# Patient Record
Sex: Female | Born: 1975 | Race: Black or African American | Hispanic: No | Marital: Married
Health system: Southern US, Community
[De-identification: ages and names within clinical notes are randomized; demographics above are authoritative.]

## PROBLEM LIST (undated history)

## (undated) DIAGNOSIS — F32A Depression, unspecified: Secondary | ICD-10-CM

## (undated) DIAGNOSIS — F329 Major depressive disorder, single episode, unspecified: Secondary | ICD-10-CM

---

## 2017-09-13 ENCOUNTER — Other Ambulatory Visit: Payer: Self-pay

## 2017-09-13 ENCOUNTER — Encounter (HOSPITAL_COMMUNITY): Payer: Self-pay | Admitting: Nurse Practitioner

## 2017-09-13 ENCOUNTER — Emergency Department (HOSPITAL_COMMUNITY)
Admission: EM | Admit: 2017-09-13 | Discharge: 2017-09-13 | Disposition: A | Discharge status: Home / Self Care | Payer: Managed Care, Other (non HMO) | Attending: Emergency Medicine | Admitting: Emergency Medicine

## 2017-09-13 DIAGNOSIS — R4584 Anhedonia: Secondary | ICD-10-CM | POA: Diagnosis present

## 2017-09-13 DIAGNOSIS — R41840 Attention and concentration deficit: Secondary | ICD-10-CM | POA: Insufficient documentation

## 2017-09-13 DIAGNOSIS — F329 Major depressive disorder, single episode, unspecified: Secondary | ICD-10-CM | POA: Insufficient documentation

## 2017-09-13 DIAGNOSIS — R5383 Other fatigue: Secondary | ICD-10-CM | POA: Diagnosis not present

## 2017-09-13 DIAGNOSIS — Z008 Encounter for other general examination: Secondary | ICD-10-CM | POA: Insufficient documentation

## 2017-09-13 DIAGNOSIS — R45851 Suicidal ideations: Secondary | ICD-10-CM | POA: Diagnosis not present

## 2017-09-13 DIAGNOSIS — G479 Sleep disorder, unspecified: Secondary | ICD-10-CM | POA: Diagnosis not present

## 2017-09-13 HISTORY — DX: Depression, unspecified: F32.A

## 2017-09-13 HISTORY — DX: Major depressive disorder, single episode, unspecified: F32.9

## 2017-09-13 LAB — SALICYLATE LEVEL

## 2017-09-13 LAB — CBC WITH DIFFERENTIAL/PLATELET
Basophils Absolute: 0 10*3/uL (ref 0.0–0.1)
Basophils Relative: 1 %
EOS ABS: 0 10*3/uL (ref 0.0–0.7)
Eosinophils Relative: 0 %
HEMATOCRIT: 37.8 % (ref 36.0–46.0)
HEMOGLOBIN: 11.8 g/dL — AB (ref 12.0–15.0)
LYMPHS ABS: 1.5 10*3/uL (ref 0.7–4.0)
LYMPHS PCT: 30 %
MCH: 24.6 pg — AB (ref 26.0–34.0)
MCHC: 31.2 g/dL (ref 30.0–36.0)
MCV: 78.8 fL (ref 78.0–100.0)
MONOS PCT: 6 %
Monocytes Absolute: 0.3 10*3/uL (ref 0.1–1.0)
NEUTROS ABS: 3 10*3/uL (ref 1.7–7.7)
NEUTROS PCT: 63 %
Platelets: 380 10*3/uL (ref 150–400)
RBC: 4.8 MIL/uL (ref 3.87–5.11)
RDW: 15.5 % (ref 11.5–15.5)
WBC: 4.8 10*3/uL (ref 4.0–10.5)

## 2017-09-13 LAB — BASIC METABOLIC PANEL
Anion gap: 5 (ref 5–15)
BUN: 8 mg/dL (ref 6–20)
CHLORIDE: 111 mmol/L (ref 101–111)
CO2: 23 mmol/L (ref 22–32)
CREATININE: 0.58 mg/dL (ref 0.44–1.00)
Calcium: 9 mg/dL (ref 8.9–10.3)
GFR calc Af Amer: >60 mL/min (ref >60)
GFR calc non Af Amer: >60 mL/min (ref >60)
Glucose, Bld: 88 mg/dL (ref 65–99)
POTASSIUM: 3.7 mmol/L (ref 3.5–5.1)
Sodium: 139 mmol/L (ref 135–145)

## 2017-09-13 LAB — I-STAT BETA HCG BLOOD, ED (MC, WL, AP ONLY)

## 2017-09-13 LAB — ACETAMINOPHEN LEVEL: Acetaminophen (Tylenol), Serum: <10 ug/mL — ABNORMAL LOW (ref 10–30)

## 2017-09-13 LAB — ETHANOL

## 2017-09-13 MED ORDER — ACETAMINOPHEN 325 MG PO TABS: 650.0000 mg | ORAL_TABLET | ORAL | Status: DC | PRN

## 2017-09-13 MED ORDER — ONDANSETRON HCL 4 MG PO TABS: 4.0000 mg | ORAL_TABLET | Freq: Three times a day (TID) | ORAL | Status: DC | PRN

## 2017-09-13 NOTE — Progress Notes (Signed)
Patient ID: Stacy Macdonald, female   DOB: 1976/02/06, 42 y.o.   MRN: [id number]  Pt was seen and chart reviewed with treatment team. Pt denies suicidal/homicidal ideation, denies auditory/visual hallucinations and does not appear to be responding to internal stimuli. Pt does endorse a major depressive episode this weekend with crying and thoughts of self harm. Pt denies having a plan. Pt lives with her husband who is supportive and feels safe at home. Pt is interested in intensive outpatient therapy and has an appointment tomorrow at 1330 with [facility] Out[facility] services. Pt is able to contract for safety upon discharge. Pt is psychiatrically clear for discharge.    Ethelene Hal, NP-C 09-13-2017        [phone]

## 2017-09-13 NOTE — ED Provider Notes (Signed)
[facility] DEPT Provider Note   CSN: [id number] Arrival date & time: 09/13/17  1157     History   Chief Complaint Chief Complaint  Patient presents with  . Suicidal    HPI Stacy Macdonald is a 42 y.o. female with a history of depression who presents to the emergency department today for suicidal ideation.  Patient states that since undergoing bariatric surgery in November she feels her Effexor is not working as it should.  She states that she has become more sad with loss of interest, feelings of worthlessness, lack of energy, difficulty concentrating, difficulty sleeping, irritability.  For this reason the patient states she went to her PCP office today.  When asked about suicidal ideation the patient states over the weekend she has been experiencing thoughts of harming herself.  If she was to harm herself she would overdose but does not have access to this means.  She denies prior attempts of self-harm.  She denies alcohol or drug use.  No homicidal ideation.  She denies any physical complaints at this time.  HPI  Past Medical History:  Diagnosis Date  . Depression     There are no active problems to display for this patient.   History reviewed. No pertinent surgical history.  OB History    No data available       Home Medications    Prior to Admission medications   Not on File    Family History History reviewed. No pertinent family history.  Social History Social History   Tobacco Use  . Smoking status: Not on file  Substance Use Topics  . Alcohol use: No    Frequency: Never  . Drug use: No     Allergies   Patient has no allergy information on record.   Review of Systems Review of Systems  All other systems reviewed and are negative.    Physical Exam Updated Vital Signs BP 134/80 (BP Location: Right Arm)   Pulse (!) 54   Temp 98.3 F (36.8 C) (Oral)   Resp 17   Ht 5' 8"  (1.727 m)   Wt 114.8 kg (253 lb)    SpO2 100%   BMI 38.47 kg/m   Physical Exam  Constitutional: She appears well-developed and well-nourished.  HENT:  Head: Normocephalic and atraumatic.  Right Ear: External ear normal.  Left Ear: External ear normal.  Nose: Nose normal.  Mouth/Throat: Uvula is midline, oropharynx is clear and moist and mucous membranes are normal. No tonsillar exudate.  Eyes: Pupils are equal, round, and reactive to light. Right eye exhibits no discharge. Left eye exhibits no discharge. No scleral icterus.  Neck: Trachea normal. Neck supple. No spinous process tenderness present. No neck rigidity. Normal range of motion present.  Cardiovascular: Normal rate, regular rhythm and intact distal pulses.  No murmur heard. Pulses:      Radial pulses are 2+ on the right side, and 2+ on the left side.       Dorsalis pedis pulses are 2+ on the right side, and 2+ on the left side.       Posterior tibial pulses are 2+ on the right side, and 2+ on the left side.  No lower extremity swelling or edema. Calves symmetric in size bilaterally.  Pulmonary/Chest: Effort normal and breath sounds normal. She exhibits no tenderness.  Abdominal: Soft. Bowel sounds are normal. She exhibits no distension. There is no tenderness. There is no rigidity, no rebound and no guarding.  Musculoskeletal: She  exhibits no edema.  Lymphadenopathy:    She has no cervical adenopathy.  Neurological: She is alert.  Skin: Skin is warm and dry. No rash noted. She is not diaphoretic.  Psychiatric: She has a normal mood and affect.  Nursing note and vitals reviewed.    ED Treatments / Results  Labs (all labs ordered are listed, but only abnormal results are displayed) Labs Reviewed  CBC WITH DIFFERENTIAL/PLATELET - Abnormal; Notable for the following components:      Result Value   Hemoglobin 11.8 (*)    MCH 24.6 (*)    All other components within normal limits  ACETAMINOPHEN LEVEL - Abnormal; Notable for the following components:    Acetaminophen (Tylenol), Serum <10 (*)    All other components within normal limits  BASIC METABOLIC PANEL  SALICYLATE LEVEL  ETHANOL  RAPID URINE DRUG SCREEN, HOSP PERFORMED  I-STAT BETA HCG BLOOD, ED ([facility], [facility], [facility] ONLY)    EKG  EKG Interpretation None       Radiology No results found.  Procedures Procedures (including critical care time)  Medications Ordered in ED Medications - No data to display   Initial Impression / Assessment and Plan / ED Course  I have reviewed the triage vital signs and the nursing notes.  Pertinent labs & imaging results that were available during my care of the patient were reviewed by me and considered in my medical decision making (see chart for details).     Pt presents to the ED for medical clearance.  Pt is not currently having SI with a plan to OD. She denies any HI ideations. The patients demeanor is dysphoric. Admits difficulty sleeping, loss of interests, feelings of worthlessness, lack of energy, difficulty concentrating, loss of appetite, feelings of anxiety. The patient currently does not have any acute physical complaints and is in no acute distress. TTS consulted. Pt was moved to Psych ED for further evaluation. Patient is medically cleared.   TTS recommended discharge and patient will do follow up as outpatient tomorrow for further care. She is in agreement with plan. Appears safe for discharge.   Final Clinical Impressions(s) / ED Diagnoses   Final diagnoses:  Encounter for medical clearance for patient hold    ED Discharge Orders    None       Lorelle Gibbs 09/13/17 1535    Virgel Manifold, MD 09/13/17 1630

## 2017-09-13 NOTE — BH Assessment (Addendum)
Tele Assessment Note   Patient Name: Stacy Macdonald MRN: [id number] Referring Physician: Alferd Apa Location of Patient: [facility] Location of Provider: [place] is an 42 y.o. female who presents voluntarily accompanied by Mobile Crisis reporting symptoms of depression and suicidal ideation this past weekend with no intent. She states that she was worried because she has never had thoughts like this before. Pt has a history of depression and has been taking Effexor for 1 year, but feels that it has not worked after her recent bariatric surgery in November.  Pt reports medication compliance. Pt denies past attempts. 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 PT denies homicidal ideation/ history of violence. Pt denies auditory or visual hallucinations or other psychotic symptoms. Pt states current stressors include work and the loss of her brother 3 weeks ago.   Pt lives with her husband and 65 yo son, who she says are supportive. Pt denies history of abuse and trauma. Pt reports there is a family history of her father being Bipolar. Pt has good insight and judgment. Pt's memory is typical. Pt denies legal history. ? Pt's OP history includes medication mgt from her PCP, but not previous OP or Ip treatment. Pt denies alcohol/ substance abuse. ? MSE: Pt is casually dressed, alert, oriented x4 with normal speech and normal motor behavior. Eye contact is good. Pt's mood is depressed and affect is depressed and anxious. Affect is congruent with mood. Thought process is coherent and relevant. There is no indication pt is currently responding to internal stimuli or experiencing delusional thought content. Pt was cooperative throughout assessment. Pt is currently able to contract for safety outside the hospital and wants IOP psychiatric treatment.  Jinny Blossom, NP recommends IOP  treatment.  Writer spoke with Ava at [facility] OP and made appt for 1:30 tomorrow for partial hospitalization intake. Notified pt and EDP.     Diagnosis: MDD, single episode  Past Medical History:  Past Medical History:  Diagnosis Date  . Depression     History reviewed. No pertinent surgical history.  Family History: History reviewed. No pertinent family history.  Social History:  reports that she does not drink alcohol or use drugs. Her tobacco history is not on file.  Additional Social History:  Alcohol / Drug Use Pain Medications: denies Prescriptions: denies Over the Counter: denies History of alcohol / drug use?: No history of alcohol / drug abuse Longest period of sobriety (when/how long): denies Negative Consequences of Use: (denies) Withdrawal Symptoms: (denies)  CIWA: CIWA-Ar BP: 134/80 Pulse Rate: (!) 54 COWS:    PATIENT STRENGTHS: (choose at least two) Ability for insight Average or above average intelligence Occupational psychologist fund of knowledge Motivation for treatment/growth Physical Health Supportive family/friends Work skills  Allergies: No Known Allergies  Home Medications:  (Not in a hospital admission)  OB/GYN Status:  No LMP recorded.  General Assessment Data Location of Assessment: [facility] ED TTS Assessment: In system Is this a Tele or Face-to-Face Assessment?: Tele Assessment Is this an Initial Assessment or a Re-assessment for this encounter?: Initial Assessment Marital status: Married Living Arrangements: Spouse/significant other, Children Can pt return to current living arrangement?: Yes Admission Status: Voluntary Is patient capable of signing voluntary admission?: Yes Referral Source: Technical sales engineer Crisis) Insurance type: Garment/textile technologist     Crisis Care Plan Living Arrangements: Spouse/significant other, Children Legal Guardian: (self) Name of Psychiatrist: none Name of Therapist: none  Education Status Is  patient currently in school?: No  Risk to self with the past 6 months Suicidal Ideation: No-Not Currently/Within Last 6 Months Has patient been a risk to self within the past 6 months prior to admission? : No Suicidal Intent: No Has patient had any suicidal intent within the past 6 months prior to admission? : No Is patient at risk for suicide?: Yes Suicidal Plan?: No-Not Currently/Within Last 6 Months Has patient had any suicidal plan within the past 6 months prior to admission? : Yes Access to Means: No What has been your use of drugs/alcohol within the last 12 months?: denies Previous Attempts/Gestures: No Triggers for Past Attempts: None known Intentional Self Injurious Behavior: None Family Suicide History: No Recent stressful life event(s): Loss (Comment)(bariatric surgery,stres at work) Persecutory voices/beliefs?: No Depression: Yes Depression Symptoms: Insomnia, Isolating, Loss of interest in usual pleasures, Feeling worthless/self pity, Fatigue Substance abuse history and/or treatment for substance abuse?: No Suicide prevention information given to non-admitted patients: Not applicable  Risk to Others within the past 6 months Homicidal Ideation: No Does patient have any lifetime risk of violence toward others beyond the six months prior to admission? : No Thoughts of Harm to Others: No Current Homicidal Intent: No Current Homicidal Plan: No Access to Homicidal Means: No History of harm to others?: No Assessment of Violence: None Noted Does patient have access to weapons?: No Criminal Charges Pending?: No Does patient have a court date: No Is patient on probation?: No  Psychosis Hallucinations: None noted Delusions: None noted  Mental Status Report Appearance/Hygiene: Unremarkable Eye Contact: Good Motor Activity: Unremarkable Speech: Logical/coherent Level of Consciousness: Alert Mood: Depressed, Anxious Affect: Depressed, Anxious Anxiety Level:  Moderate Thought Processes: Coherent, Relevant Judgement: Unimpaired Orientation: Person, Place, Time, Situation, Appropriate for developmental age Obsessive Compulsive Thoughts/Behaviors: None  Cognitive Functioning Concentration: Decreased Memory: Recent Intact, Remote Intact IQ: Above Average Insight: Good Impulse Control: Good Appetite: Fair Weight Loss: 40(due to bariatric surgery) Weight Gain: 0 Sleep: Decreased Total Hours of Sleep: (3-4 hrs) Vegetative Symptoms: None  ADLScreening [facility] Assessment Services) Patient's cognitive ability adequate to safely complete daily activities?: Yes Patient able to express need for assistance with ADLs?: Yes Independently performs ADLs?: Yes (appropriate for developmental age)  Prior Inpatient Therapy Prior Inpatient Therapy: No  Prior Outpatient Therapy Prior Outpatient Therapy: No Does patient have an ACCT team?: No Does patient have Intensive In-House Services?  : No Does patient have [facility] services? : No Does patient have P4CC services?: No  ADL Screening (condition at time of admission) Patient's cognitive ability adequate to safely complete daily activities?: Yes Is the patient deaf or have difficulty hearing?: No Does the patient have difficulty seeing, even when wearing glasses/contacts?: No Does the patient have difficulty concentrating, remembering, or making decisions?: No Patient able to express need for assistance with ADLs?: Yes Does the patient have difficulty dressing or bathing?: No Independently performs ADLs?: Yes (appropriate for developmental age) Does the patient have difficulty walking or climbing stairs?: No Weakness of Legs: None Weakness of Arms/Hands: None  Home Assistive Devices/Equipment Home Assistive Devices/Equipment: None  Therapy Consults (therapy consults require a physician order) PT Evaluation Needed: No OT Evalulation Needed: No SLP Evaluation Needed: No Abuse/Neglect Assessment  (Assessment to be complete while patient is alone) Abuse/Neglect Assessment Can Be Completed: Yes Physical Abuse: Denies Verbal Abuse: Denies Sexual Abuse: Denies Exploitation of patient/patient's resources: Denies Self-Neglect: Denies Values / Beliefs Cultural Requests During Hospitalization: None Spiritual Requests During Hospitalization: None Consults Spiritual  Care Consult Needed: No Social Work Consult Needed: No Regulatory affairs officer (For Healthcare) Does Patient Have a Medical Advance Directive?: No Would patient like information on creating a medical advance directive?: No - Patient declined    Additional Information 1:1 In Past 12 Months?: No CIRT Risk: No Elopement Risk: No Does patient have medical clearance?: Yes     Disposition:  Disposition Initial Assessment Completed for this Encounter: Yes Disposition of Patient: Outpatient treatment(Partial Hospitalization) Type of outpatient treatment: Psych Intensive Outpatient  This service was provided via telemedicine using a 2-way, interactive audio and video technology.  Names of all persons participating in this telemedicine service and their role in this encounter. Name: Ellouise Newer, MS, [facility] Role: Triage Specialist             [facility] 09/13/2017 3:36 PM

## 2017-09-13 NOTE — Discharge Instructions (Signed)
Please follow handouts and instructions given by behavorial health. If you develop worsening or new concerning symptoms you can return to the emergency department for re-evaluation.

## 2017-09-14 ENCOUNTER — Ambulatory Visit (HOSPITAL_COMMUNITY): Payer: Managed Care, Other (non HMO) | Admitting: Licensed Clinical Social Worker

## 2017-09-15 ENCOUNTER — Telehealth (HOSPITAL_COMMUNITY): Payer: Self-pay | Admitting: Professional

## 2020-05-26 ENCOUNTER — Emergency Department: Admit: 2020-05-26 | Discharge status: Against Medical Advice | Payer: Self-pay

## 2021-05-17 ENCOUNTER — Emergency Department (HOSPITAL_COMMUNITY): Payer: Managed Care, Other (non HMO)

## 2021-05-17 ENCOUNTER — Emergency Department (HOSPITAL_COMMUNITY)
Admission: EM | Admit: 2021-05-17 | Discharge: 2021-05-17 | Disposition: A | Discharge status: Home / Self Care | Payer: Managed Care, Other (non HMO) | Attending: Student | Admitting: Student

## 2021-05-17 ENCOUNTER — Encounter (HOSPITAL_COMMUNITY): Payer: Self-pay

## 2021-05-17 ENCOUNTER — Other Ambulatory Visit: Payer: Self-pay

## 2021-05-17 DIAGNOSIS — M25512 Pain in left shoulder: Secondary | ICD-10-CM | POA: Insufficient documentation

## 2021-05-17 DIAGNOSIS — R55 Syncope and collapse: Secondary | ICD-10-CM | POA: Insufficient documentation

## 2021-05-17 DIAGNOSIS — R4020 Unspecified coma: Secondary | ICD-10-CM

## 2021-05-17 LAB — URINALYSIS, ROUTINE W REFLEX MICROSCOPIC
Bilirubin Urine: NEGATIVE
Glucose, UA: NEGATIVE mg/dL
Hgb urine dipstick: NEGATIVE
Ketones, ur: 5 mg/dL — AB
Leukocytes,Ua: NEGATIVE
Nitrite: NEGATIVE
Protein, ur: 30 mg/dL — AB
Specific Gravity, Urine: 1.014 (ref 1.005–1.030)
pH: 6 (ref 5.0–8.0)

## 2021-05-17 LAB — CBC WITH DIFFERENTIAL/PLATELET
Abs Immature Granulocytes: 0.06 10*3/uL (ref 0.00–0.07)
Basophils Absolute: 0 10*3/uL (ref 0.0–0.1)
Basophils Relative: 0 %
Eosinophils Absolute: 0 10*3/uL (ref 0.0–0.5)
Eosinophils Relative: 0 %
HCT: 37.1 % (ref 36.0–46.0)
Hemoglobin: 10.9 g/dL — ABNORMAL LOW (ref 12.0–15.0)
Immature Granulocytes: 1 %
Lymphocytes Relative: 9 %
Lymphs Abs: 1 10*3/uL (ref 0.7–4.0)
MCH: 22.4 pg — ABNORMAL LOW (ref 26.0–34.0)
MCHC: 29.4 g/dL — ABNORMAL LOW (ref 30.0–36.0)
MCV: 76.3 fL — ABNORMAL LOW (ref 80.0–100.0)
Monocytes Absolute: 0.6 10*3/uL (ref 0.1–1.0)
Monocytes Relative: 6 %
Neutro Abs: 9 10*3/uL — ABNORMAL HIGH (ref 1.7–7.7)
Neutrophils Relative %: 84 %
Platelets: 422 10*3/uL — ABNORMAL HIGH (ref 150–400)
RBC: 4.86 MIL/uL (ref 3.87–5.11)
RDW: 19.2 % — ABNORMAL HIGH (ref 11.5–15.5)
WBC: 10.7 10*3/uL — ABNORMAL HIGH (ref 4.0–10.5)
nRBC: 0 % (ref 0.0–0.2)

## 2021-05-17 LAB — PREGNANCY, URINE: Preg Test, Ur: NEGATIVE

## 2021-05-17 LAB — COMPREHENSIVE METABOLIC PANEL
ALT: 35 U/L (ref 0–44)
AST: 42 U/L — ABNORMAL HIGH (ref 15–41)
Albumin: 3.8 g/dL (ref 3.5–5.0)
Alkaline Phosphatase: 91 U/L (ref 38–126)
Anion gap: 7 (ref 5–15)
BUN: 7 mg/dL (ref 6–20)
CO2: 23 mmol/L (ref 22–32)
Calcium: 7.9 mg/dL — ABNORMAL LOW (ref 8.9–10.3)
Chloride: 105 mmol/L (ref 98–111)
Creatinine, Ser: 0.71 mg/dL (ref 0.44–1.00)
GFR, Estimated: >60 mL/min (ref >60)
Glucose, Bld: 75 mg/dL (ref 70–99)
Potassium: 3.1 mmol/L — ABNORMAL LOW (ref 3.5–5.1)
Sodium: 135 mmol/L (ref 135–145)
Total Bilirubin: 0.6 mg/dL (ref 0.3–1.2)
Total Protein: 7.3 g/dL (ref 6.5–8.1)

## 2021-05-17 LAB — CBG MONITORING, ED: Glucose-Capillary: 70 mg/dL (ref 70–99)

## 2021-05-17 MED ORDER — ONDANSETRON HCL 4 MG/2ML IJ SOLN
4.0000 mg | Freq: Once | INTRAMUSCULAR | Status: AC
  Administered 2021-05-17: 4 mg via INTRAVENOUS
  Filled 2021-05-17: qty 2

## 2021-05-17 MED ORDER — MORPHINE SULFATE (PF) 4 MG/ML IV SOLN
4.0000 mg | Freq: Once | INTRAVENOUS | Status: AC
  Administered 2021-05-17: 4 mg via INTRAVENOUS
  Filled 2021-05-17: qty 1

## 2021-05-17 MED ORDER — ACETAMINOPHEN 325 MG PO TABS
650.0000 mg | ORAL_TABLET | Freq: Once | ORAL | Status: AC
  Administered 2021-05-17: 650 mg via ORAL
  Filled 2021-05-17: qty 2

## 2021-05-17 NOTE — ED Provider Notes (Signed)
[facility] EMERGENCY DEPARTMENT Provider Note   CSN: [id number] Arrival date & time: 05/17/21  1810     History Chief Complaint  Patient presents with   Loss of Consciousness    Stacy Macdonald is a 45 y.o. female.  HPI  Patient with no significant medical history presents to the emergency department with chief complaint of a syncopal episode.  Patient states today she was at the store and started to see spots in her eyes and her vision started to close and then she passed out.  She states prior to passing out she did not have headache, lightheadedness, dizziness, chest pain, shortness of breath, becoming diaphoretic nausea or vomiting.  She denies become incontinent, shaking, biting her tongue, states she is never had this in the past, she has no significant cardiac history, no she of PEs or DVTs.  she states that her only complaint is that she is having some left shoulder pain,  she denies neck pain, back pain, chest pain, abdominal pain, pain in her lower extremities.   She is never had this in the past, denies relieving or aggravating factors.  Past Medical History:  Diagnosis Date   Depression     There are no problems to display for this patient.   History reviewed. No pertinent surgical history.   OB History   No obstetric history on file.     History reviewed. No pertinent family history.  Social History   Substance Use Topics   Alcohol use: No   Drug use: No    Home Medications Prior to Admission medications   Medication Sig Start Date End Date Taking? Authorizing Provider  acetaminophen (TYLENOL) 500 MG tablet Take 1,000 mg by mouth every 6 (six) hours as needed for mild pain or headache.    [provider]  calcium citrate-vitamin D (CELEBRATE CALCIUM CITRATE) 500-500 MG-UNIT chewable tablet Chew 2 tablets by mouth daily.    [provider]  Multiple Vitamin (MULTIVITAMIN WITH MINERALS) TABS tablet Take 1 tablet by mouth daily.     [provider]  tolterodine (DETROL) 1 MG tablet Take 1 mg by mouth 2 (two) times daily.    [provider]  venlafaxine XR (EFFEXOR-XR) 37.5 MG 24 hr capsule Take 37.5 mg by mouth daily with breakfast.    [provider]    Allergies    Patient has no known allergies.  Review of Systems   Review of Systems  Constitutional:  Negative for chills and fever.  HENT:  Negative for congestion.   Respiratory:  Negative for shortness of breath.   Cardiovascular:  Negative for chest pain.  Gastrointestinal:  Negative for abdominal pain.  Genitourinary:  Negative for enuresis.  Musculoskeletal:  Negative for back pain.       Right  shoulder pain.  Skin:  Negative for rash.  Neurological:  Negative for dizziness and headaches.  Hematological:  Does not bruise/bleed easily.   Physical Exam Updated Vital Signs BP (!) 143/86   Pulse 66   Temp 97.8 F (36.6 C) (Oral)   Resp (!) 26   Ht 5' 7"  (1.702 m)   Wt 98.4 kg   LMP 05/04/2021 (Approximate)   SpO2 100%   BMI 33.99 kg/m   Physical Exam Vitals and nursing note reviewed.  Constitutional:      General: She is not in acute distress.    Appearance: She is not ill-appearing.  HENT:     Head: Normocephalic and atraumatic.  Comments: No deformities of the head present, no raccoon eyes or battle sign present.    Nose: No congestion.  Eyes:     Extraocular Movements: Extraocular movements intact.     Conjunctiva/sclera: Conjunctivae normal.     Pupils: Pupils are equal, round, and reactive to light.  Cardiovascular:     Rate and Rhythm: Normal rate and regular rhythm.     Pulses: Normal pulses.     Heart sounds: No murmur heard.   No friction rub. No gallop.  Pulmonary:     Effort: No respiratory distress.     Breath sounds: No wheezing, rhonchi or rales.  Chest:     Chest wall: No tenderness.  Abdominal:     Palpations: Abdomen is soft.     Tenderness: There is no abdominal tenderness. There is  no right CVA tenderness or left CVA tenderness.  Musculoskeletal:     Comments: Patient has full range of motion, 5 of 5 strength neurovascular tact in the upper lower extremities.  She does have slight tenderness to palpation along her left shoulder no crepitus or deformities present.   Skin:    General: Skin is warm and dry.  Neurological:     Mental Status: She is alert.     GCS: GCS eye subscore is 4. GCS verbal subscore is 5. GCS motor subscore is 6.     Cranial Nerves: No cranial nerve deficit.     Sensory: Sensation is intact.     Motor: No weakness.     Coordination: Romberg sign negative. Finger-Nose-Finger Test normal.     Comments: Cranial nerves II through XII grossly intact, patient has no difficulty word finding, no slurring of her words, able to follow two-step commands, no unilateral weakness present.  Psychiatric:        Mood and Affect: Mood normal.    ED Results / Procedures / Treatments   Labs (all labs ordered are listed, but only abnormal results are displayed) Labs Reviewed  COMPREHENSIVE METABOLIC PANEL - Abnormal; Notable for the following components:      Result Value   Potassium 3.1 (*)    Calcium 7.9 (*)    AST 42 (*)    All other components within normal limits  CBC WITH DIFFERENTIAL/PLATELET - Abnormal; Notable for the following components:   WBC 10.7 (*)    Hemoglobin 10.9 (*)    MCV 76.3 (*)    MCH 22.4 (*)    MCHC 29.4 (*)    RDW 19.2 (*)    Platelets 422 (*)    Neutro Abs 9.0 (*)    All other components within normal limits  URINALYSIS, ROUTINE W REFLEX MICROSCOPIC - Abnormal; Notable for the following components:   Ketones, ur 5 (*)    Protein, ur 30 (*)    Bacteria, UA RARE (*)    All other components within normal limits  PREGNANCY, URINE  CBG MONITORING, ED    EKG EKG Interpretation  Date/Time:  Saturday May 17 2021 20:01:09 EDT Ventricular Rate:  87 PR Interval:  164 QRS Duration: 91 QT Interval:  393 QTC  Calculation: 473 R Axis:   40 Text Interpretation: Sinus rhythm Borderline T abnormalities, anterior leads No significant change since last tracing Confirmed by Thayer Jew [phone]) on 05/17/2021 11:18:32 PM  Radiology CT Head Wo Contrast  Result Date: 05/17/2021 CLINICAL DATA:  Facial trauma EXAM: CT HEAD WITHOUT CONTRAST TECHNIQUE: Contiguous axial images were obtained from the base of the skull through the vertex  without intravenous contrast. COMPARISON:  None. FINDINGS: Brain: No evidence of acute infarction, hemorrhage, cerebral edema, mass, mass effect, or midline shift. Ventricles and sulci are normal for age. No extra-axial fluid collection. Vascular: No hyperdense vessel or unexpected calcification. Skull: Normal. Negative for fracture or focal lesion. Sinuses/Orbits: No acute finding. Other: The mastoid air cells are well aerated. No significant hematoma or laceration. IMPRESSION: No acute intracranial process. Electronically Signed   By: Merilyn Baba M.D.   On: 05/17/2021 21:07   DG Chest Port 1 View  Result Date: 05/17/2021 CLINICAL DATA:  Syncopal episode EXAM: PORTABLE CHEST 1 VIEW COMPARISON:  None. FINDINGS: The heart size and mediastinal contours are within normal limits. Both lungs are clear. The visualized skeletal structures are unremarkable. IMPRESSION: No active disease. Electronically Signed   By: Merilyn Baba M.D.   On: 05/17/2021 20:37   DG Shoulder Left Portable  Result Date: 05/17/2021 CLINICAL DATA:  Right shoulder and proximal humeral pain. EXAM: LEFT SHOULDER COMPARISON:  None. FINDINGS: Single view radiograph of the left shoulder as wrong side was ordered. Unremarkable. IMPRESSION: Unremarkable single view radiograph of the left shoulder as wrong side was ordered. Electronically Signed   By: Iven Finn M.D.   On: 05/17/2021 23:00   DG Shoulder Right Port  Result Date: 05/17/2021 CLINICAL DATA:  Syncopal episode today having right shoulder pain. EXAM:  PORTABLE RIGHT SHOULDER COMPARISON:  None. FINDINGS: There is no evidence of fracture or dislocation. There is no evidence of arthropathy or other focal bone abnormality. Soft tissues are unremarkable. IMPRESSION: Negative two-view radiograph of the right shoulder. Electronically Signed   By: Iven Finn M.D.   On: 05/17/2021 23:01   CT Maxillofacial WO CM  Result Date: 05/17/2021 CLINICAL DATA:  Facial trauma EXAM: CT MAXILLOFACIAL WITHOUT CONTRAST TECHNIQUE: Multidetector CT imaging of the maxillofacial structures was performed. Multiplanar CT image reconstructions were also generated. COMPARISON:  None. FINDINGS: Osseous: No fracture or mandibular dislocation. No destructive process. Orbits: Negative. No traumatic or inflammatory finding. Sinuses: Clear. Soft tissues: No soft tissue defect or hematoma. Limited intracranial: No significant or unexpected finding. IMPRESSION: No acute facial bone fracture. Electronically Signed   By: Merilyn Baba M.D.   On: 05/17/2021 20:35    Procedures Procedures   Medications Ordered in ED Medications  acetaminophen (TYLENOL) tablet 650 mg (650 mg Oral Given 05/17/21 2029)  morphine 4 MG/ML injection 4 mg (4 mg Intravenous Given 05/17/21 2231)  ondansetron (ZOFRAN) injection 4 mg (4 mg Intravenous Given 05/17/21 2231)    ED Course  I have reviewed the triage vital signs and the nursing notes.  Pertinent labs & imaging results that were available during my care of the patient were reviewed by me and considered in my medical decision making (see chart for details).    MDM Rules/Calculators/A&P                          Initial impression-patient presents after syncopal episode.  She is alert, does not appear in acute stress, vital signs reassuring.  Will obtain basic lab work-up CT head face shoulder and reassess.  Work-up-CBC shows slight leukocytosis of 10.7, normocytic anemia hemoglobin 10.9 appears to be her baseline.  CMP shows slight hypokalemia  3.1, calcium 7.9, AST 42 UA negative for nitrates, leukocytes, hematuria, urine pregnancy negative, DG of right shoulder negative for acute findings, CT head maxillofacial negative for acute findings, chest x-ray negative.  EKG sinus without signs of ischemia.  Reassessment-patient is reassessed states she has some pain at this time, will provide her with morphine and reassess.  Rule out- low suspicion for CVA or intracranial head bleed as patient denies change in vision, paresthesias or weakness to upper lower extremities, no neuro deficits noted on exam, CT head did not reveal any acute findings.  Low suspicion for seizure as presentation is atypical of etiology, no biting of her tongue, no urine incontinency, no postictal state.  Low suspicion for ACS or arrhythmias as patient denies chest pain, shortness of breath, no hypoperfusion or fluid overload on exam, EKG sinus without signs of ischemia.  Low suspicion for systemic infection as patient is nontoxic-appearing, vital signs reassuring, no obvious source infection noted on exam.  Low suspicion for orthopedic injury as imaging all negative for acute findings.   Plan-  Syncopal episode-possible this is vasovagal, will have her follow-up with PCP for further evaluation.    Vital signs have remained stable, no indication for hospital admission.  Patient discussed with attending and they agreed with assessment and plan.  Patient given at home care as well strict return precautions.  Patient verbalized that they understood agreed to said plan.  Final Clinical Impression(s) / ED Diagnoses Final diagnoses:  Syncope and collapse    Rx / DC Orders ED Discharge Orders     None        Marcello Fennel, PA-C 05/17/21 2323    Teressa Lower, MD 05/18/21 [phone]

## 2021-05-17 NOTE — ED Triage Notes (Signed)
Pt reports being at the grocery store, seeing "flashing lights", and waking up sitting on the floor. VSS and BS 77 with EMS.

## 2021-05-17 NOTE — Discharge Instructions (Addendum)
Lab work and imaging are reassuring.  Suspect pain in your right shoulder is muscular strain recommend over-the-counter pain medications.  Please follow your PCP for further evaluation.  Come back to the emergency department if you develop chest pain, shortness of breath, severe abdominal pain, uncontrolled nausea, vomiting, diarrhea.

## 2021-09-25 IMAGING — CT CT MAXILLOFACIAL W/O CM
3 of 4 series · 16 of 47 positions shown, 19 images · non-contrast
Comparison: None.

CLINICAL DATA: Facial trauma

EXAM:
CT MAXILLOFACIAL WITHOUT CONTRAST
TECHNIQUE: Multidetector CT imaging of the maxillofacial structures was
performed. Multiplanar CT image reconstructions were also generated.

[Series 9: max soft · axial · 0.38mm/px · z∈[+250,+412]mm · 10 of 95 slices shown, 13 images]
[im 7/95  brain]
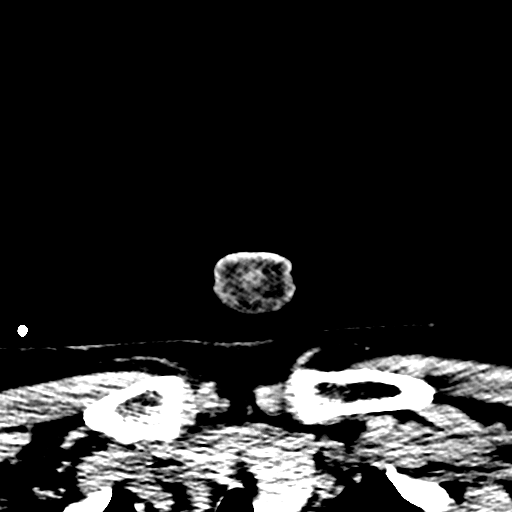
[im 7/95  bone]
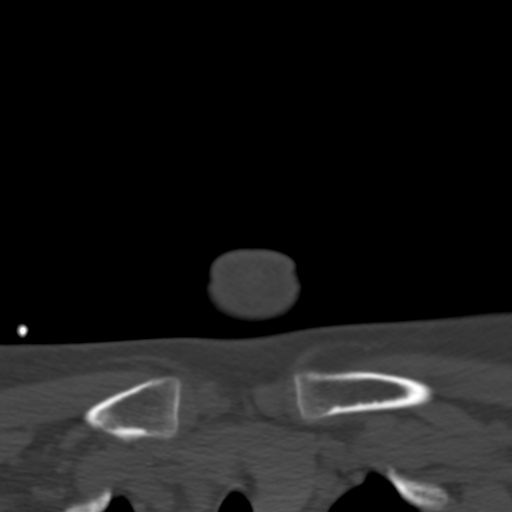
[im 17/95  bone]
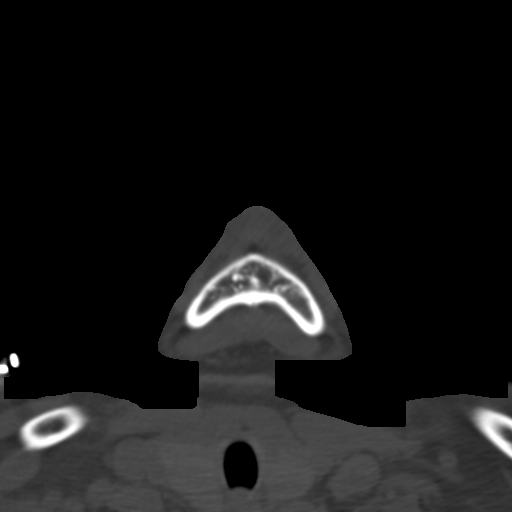
[im 23/95  bone]
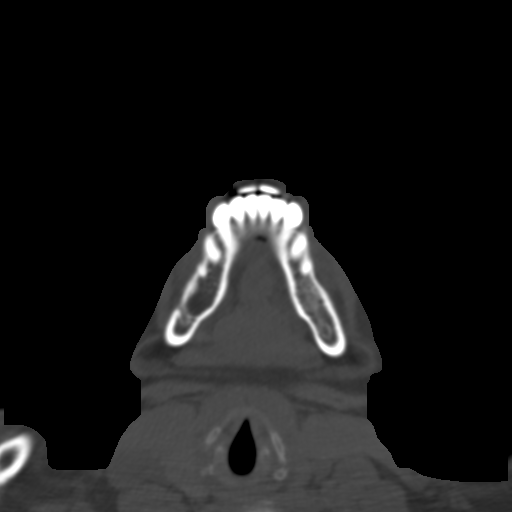
[im 36/95  bone]
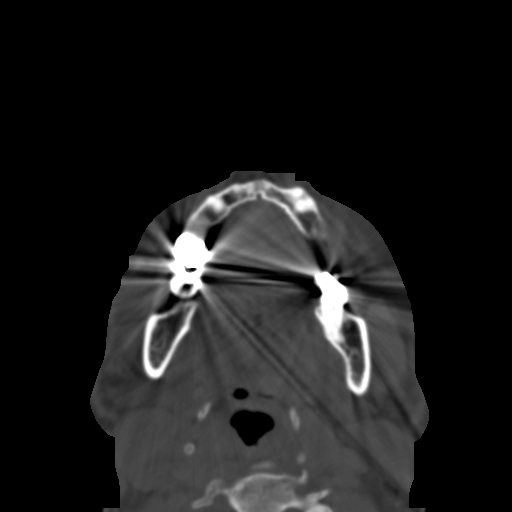
[im 46/95  brain]
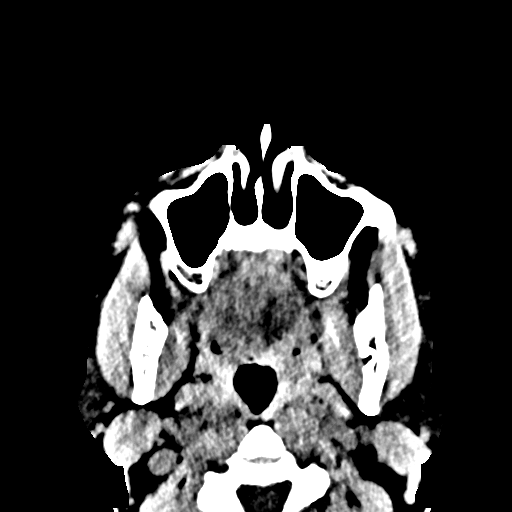
[im 46/95  bone]
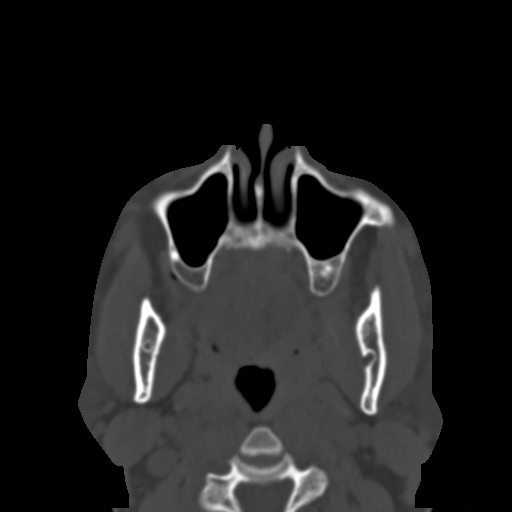
[im 52/95  bone]
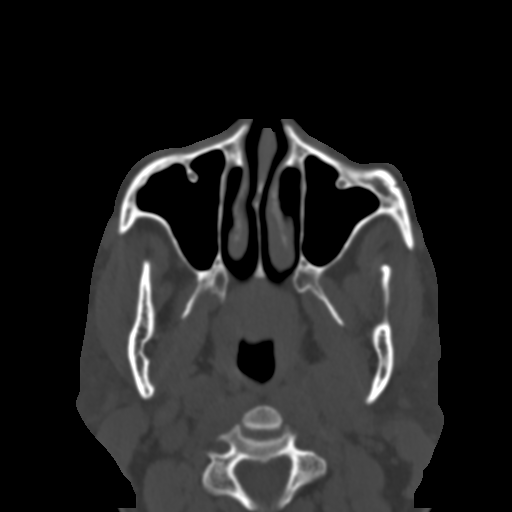
[im 62/95  bone]
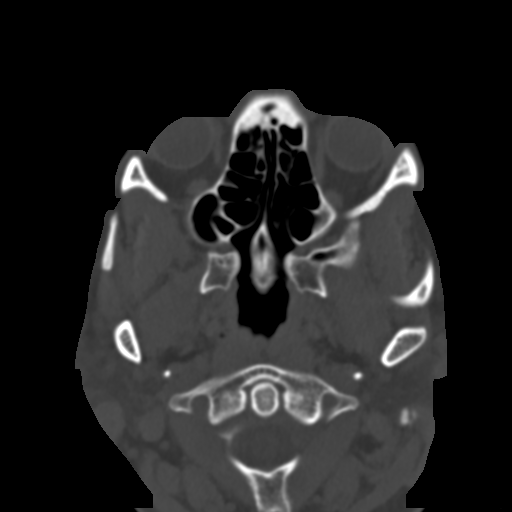
[im 72/95  bone]
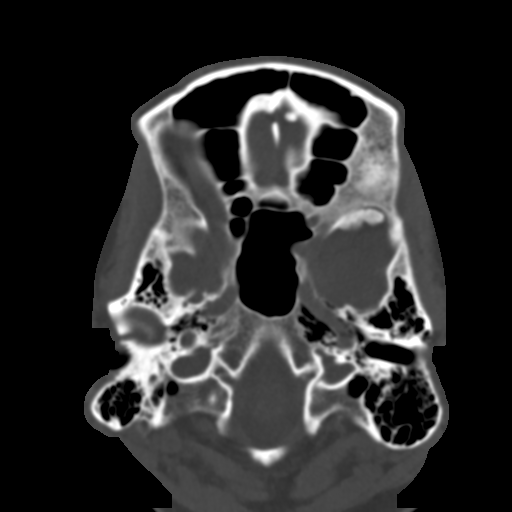
[im 78/95  brain]
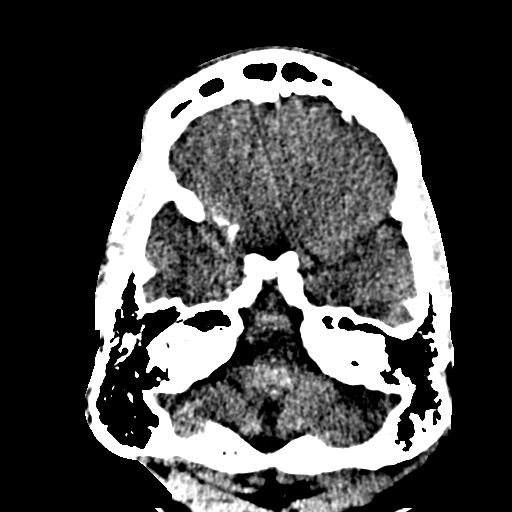
[im 78/95  bone]
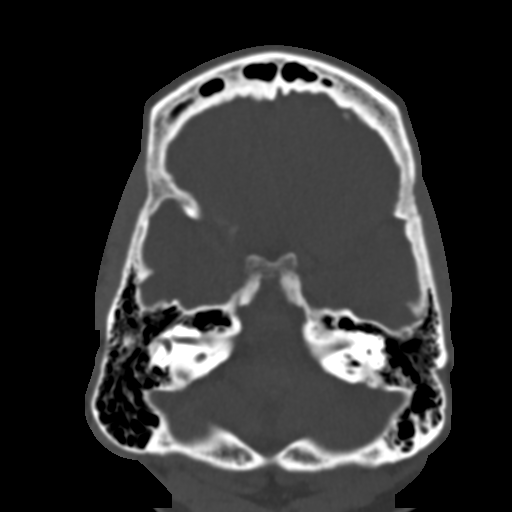
[im 88/95  bone]
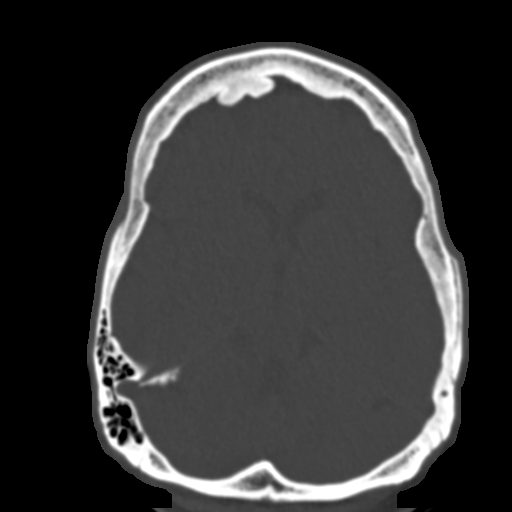

[Series 14: sagittal soft · sagittal · 0.35mm/px · 3 of 84 slices shown]
[im 28/84  bone]
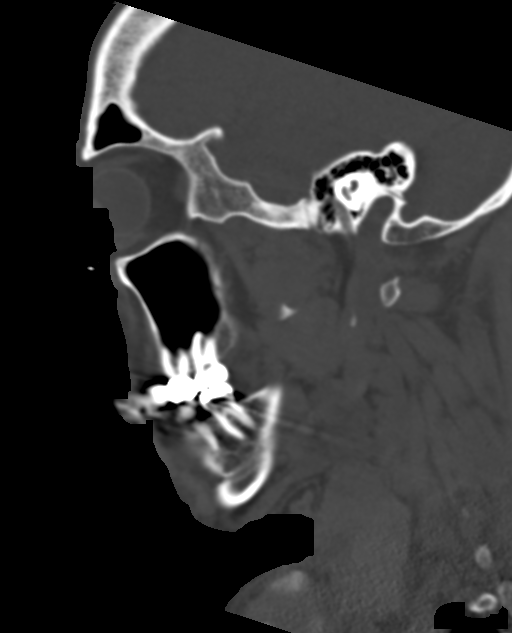
[im 42/84  bone]
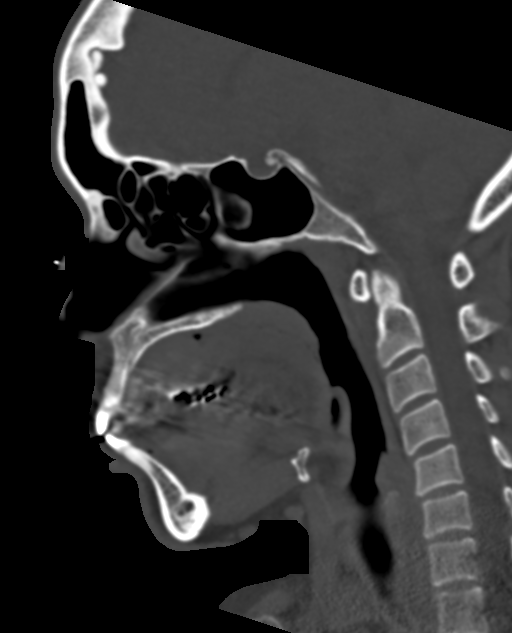
[im 56/84  bone]
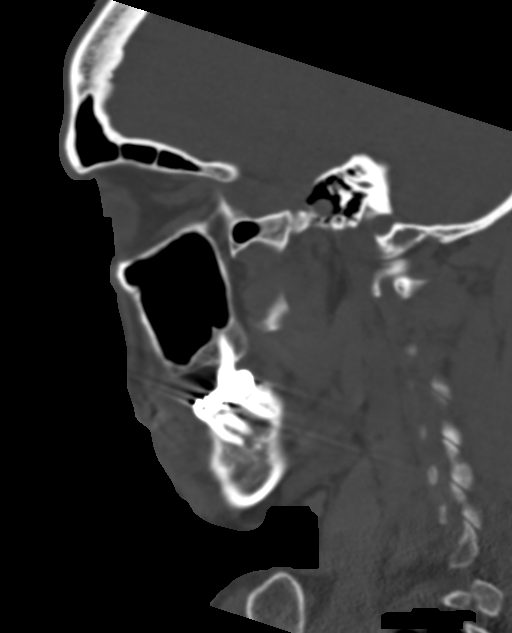

[Series 15: coronal bone · coronal · 0.36mm/px · 3 of 82 slices shown]
[im 21/82  bone]
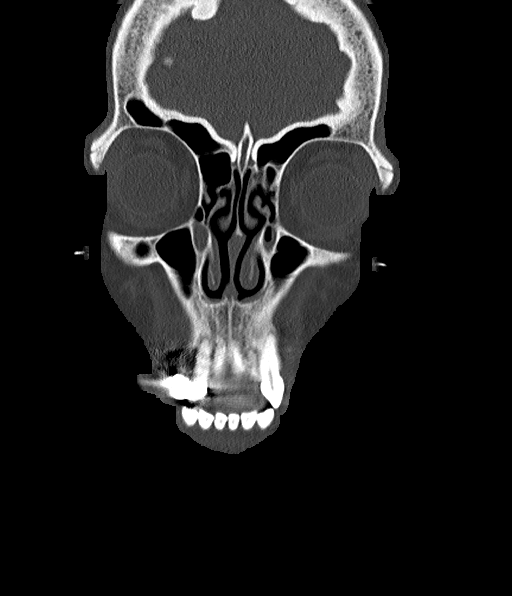
[im 41/82  bone]
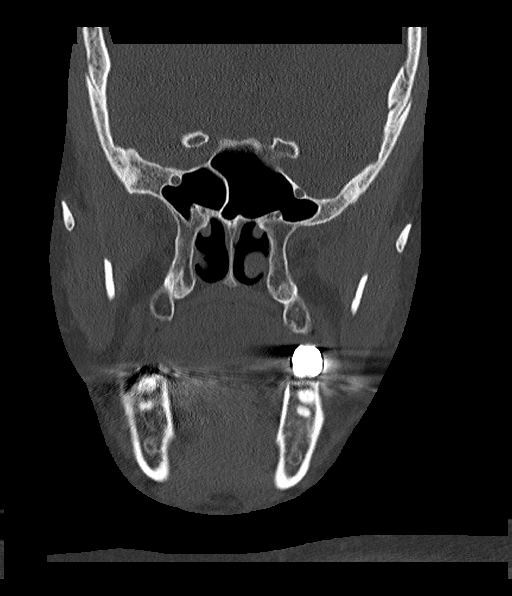
[im 61/82  bone]
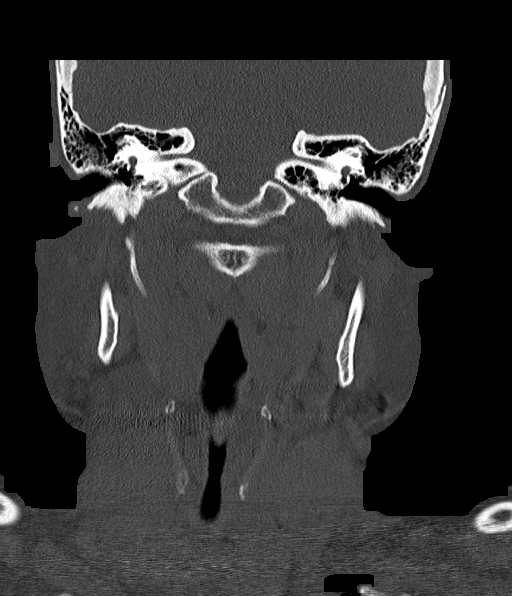

[16 of 47 positions shown; findings below may reference images not displayed]

FINDINGS: Osseous: No fracture or mandibular dislocation. No destructive
process.

Orbits: Negative. No traumatic or inflammatory finding.

Sinuses: Clear.

Soft tissues: No soft tissue defect or hematoma.

Limited intracranial: No significant or unexpected finding.
IMPRESSION: No acute facial bone fracture.

## 2021-09-25 IMAGING — DX DG CHEST 1V PORT
1 series · 1 of 1 positions shown · non-contrast
Comparison: None.

CLINICAL DATA: Syncopal episode

EXAM:
PORTABLE CHEST 1 VIEW

[chest ap]
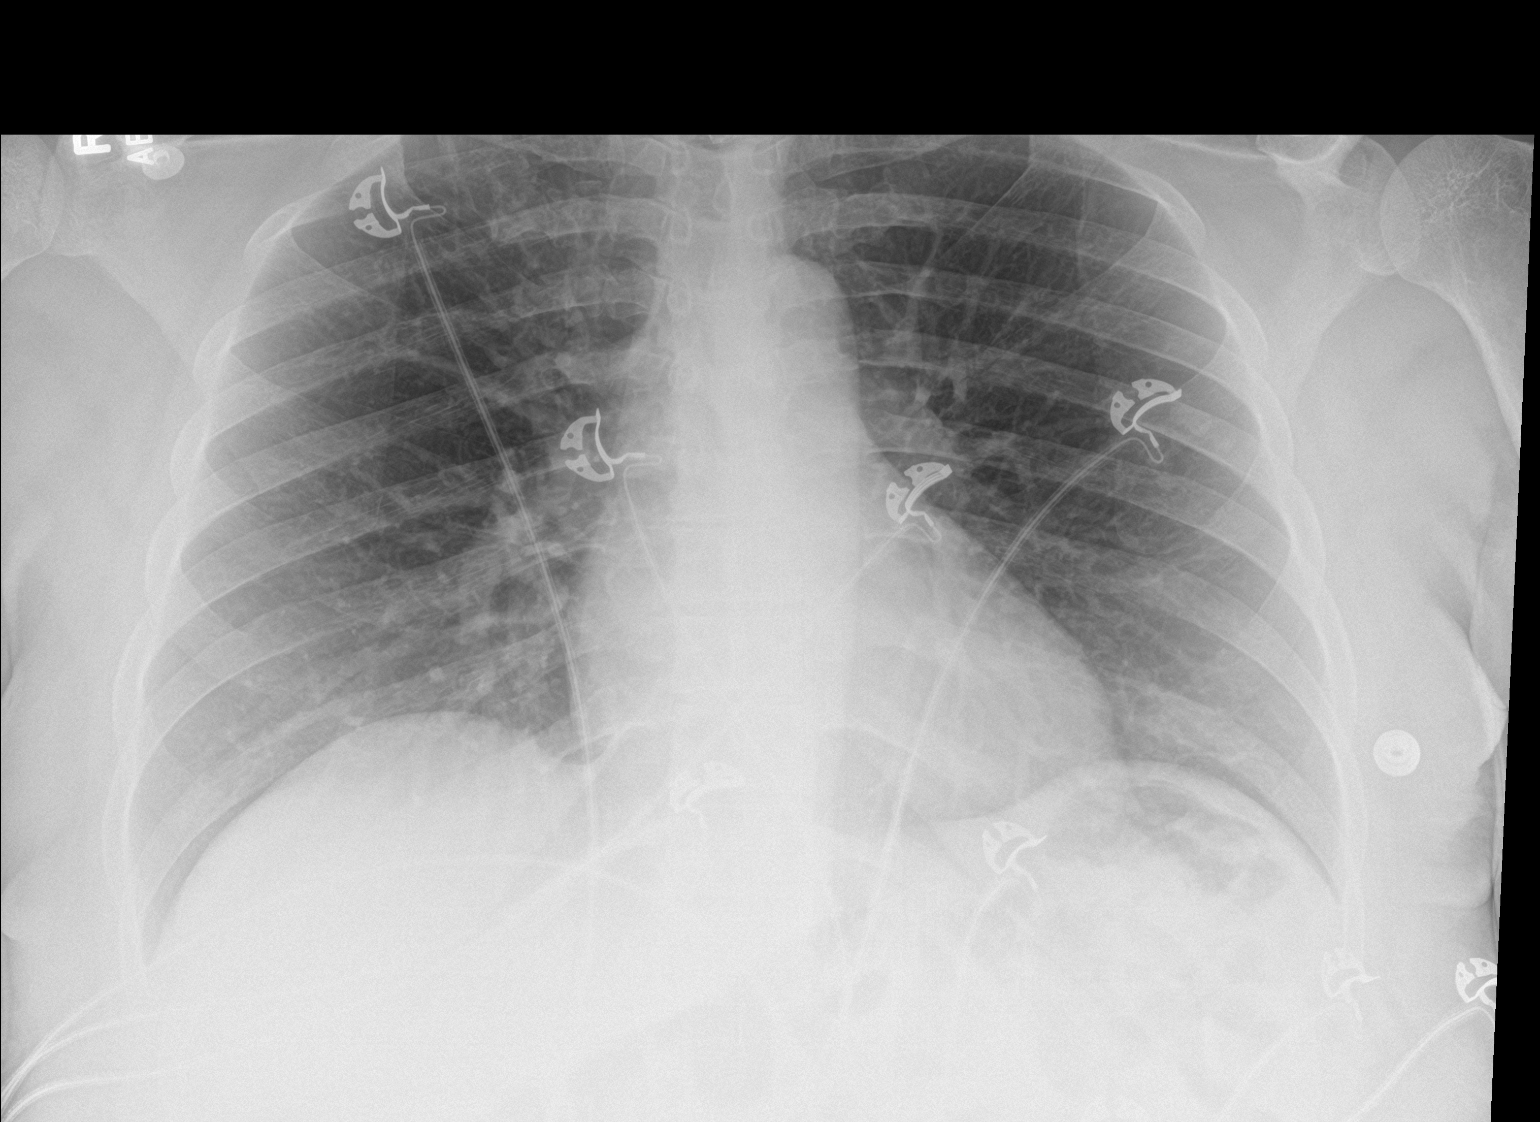

[1 of 1 positions shown; findings below may reference images not displayed]

FINDINGS: The heart size and mediastinal contours are within normal limits.
Both lungs are clear. The visualized skeletal structures are
unremarkable.
IMPRESSION: No active disease.

## 2021-09-25 IMAGING — CT CT HEAD W/O CM
4 of 5 series · 13 of 47 positions shown, 15 images · non-contrast
Comparison: None.

CLINICAL DATA: Facial trauma

EXAM:
CT HEAD WITHOUT CONTRAST
TECHNIQUE: Contiguous axial images were obtained from the base of the skull
through the vertex without intravenous contrast.

[Series 4: head w o · axial · 0.42mm/px · z∈[+381,+411]mm · 2 of 31 slices shown]
[im 7/31  brain]
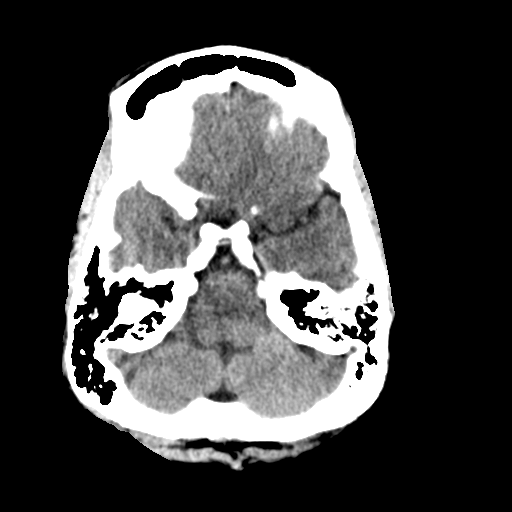
[im 13/31  brain]
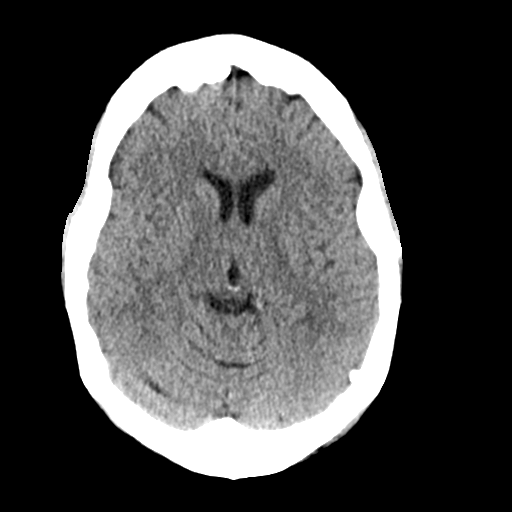

[Series 6: coronal soft · coronal · 0.31mm/px · 3 of 66 slices shown]
[im 22/66  brain]
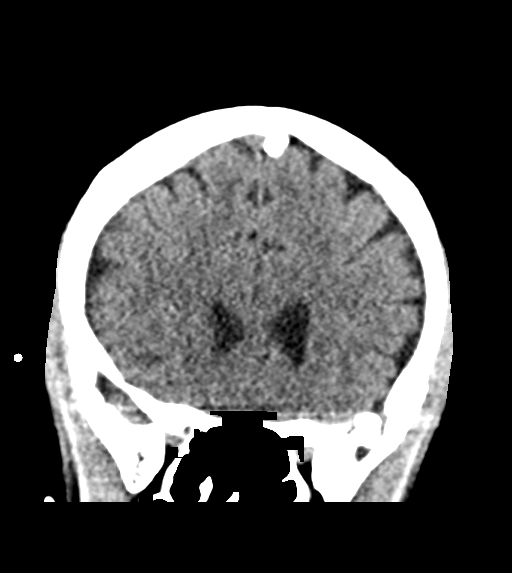
[im 29/66  brain]
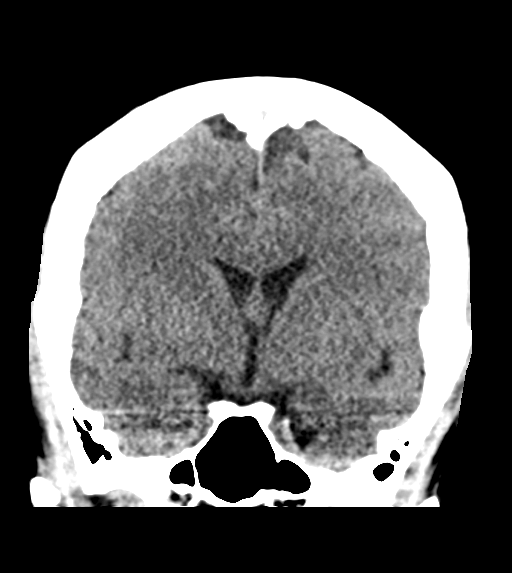
[im 37/66  brain]
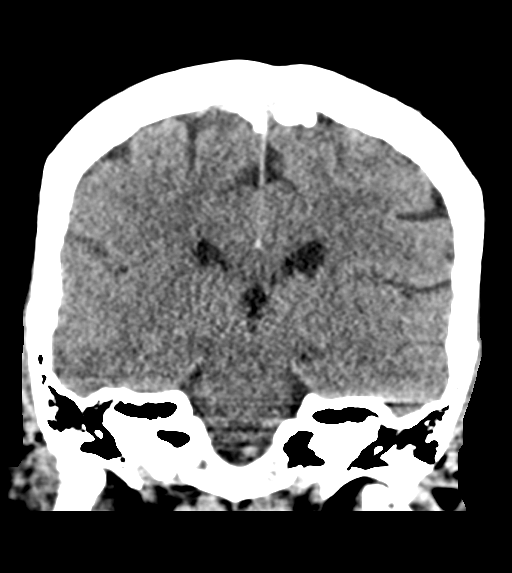

[Series 7: sagittal soft · sagittal · 0.29mm/px · 3 of 62 slices shown]
[im 21/62  brain]
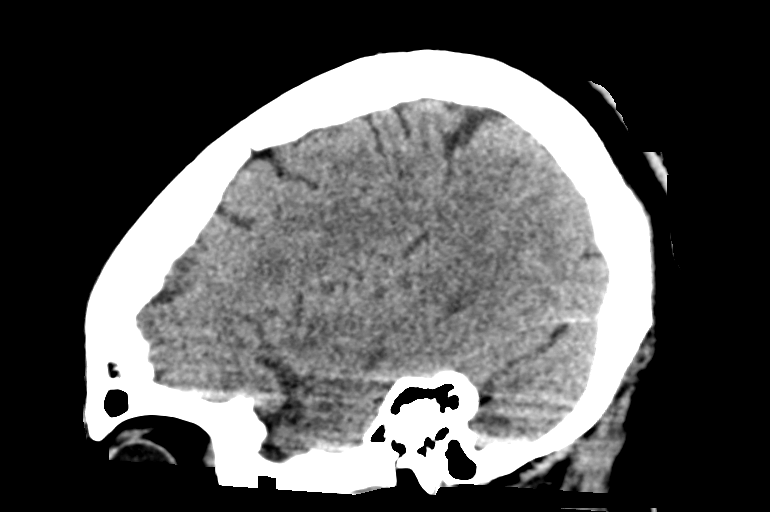
[im 31/62  brain]
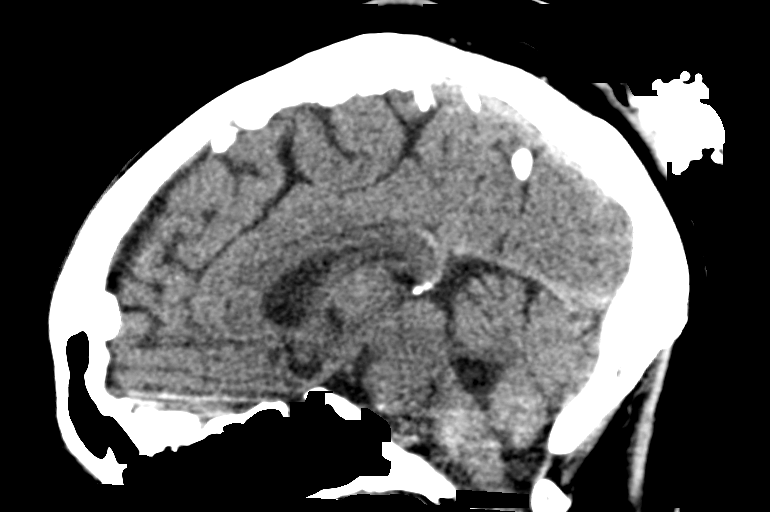
[im 41/62  brain]
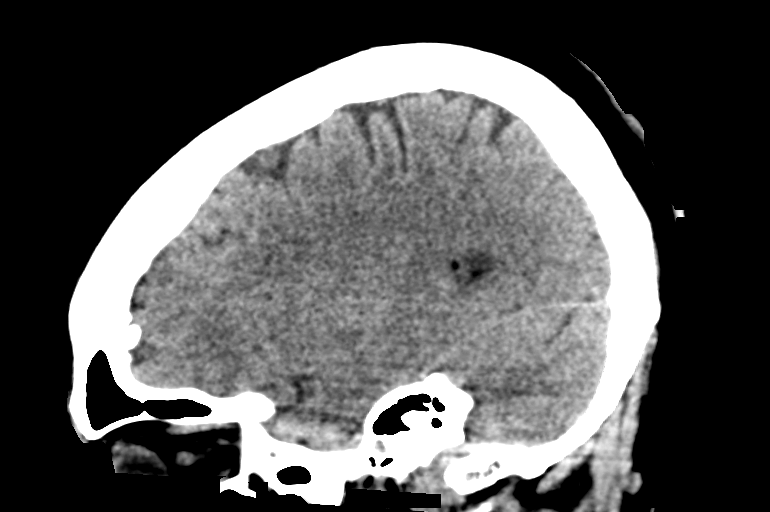

[Series 8: head ax w o · axial · 0.31mm/px · z∈[+371,+469]mm · 5 of 31 slices shown, 7 images]
[im 6/31  brain]
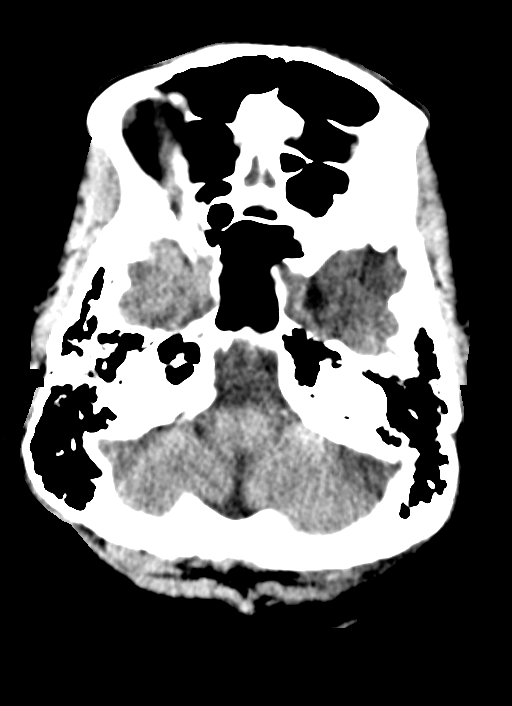
[im 6/31  bone]
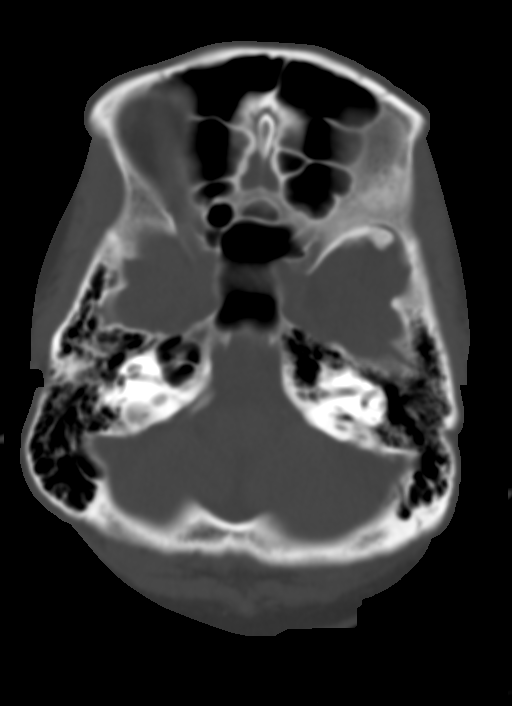
[im 11/31  brain]
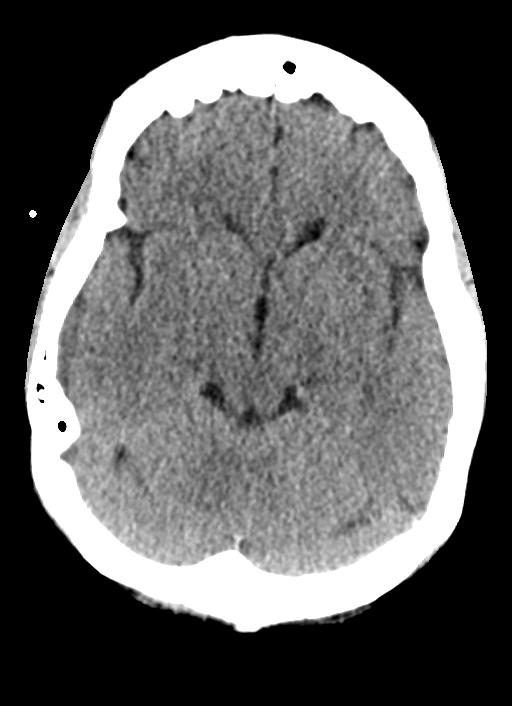
[im 16/31  brain]
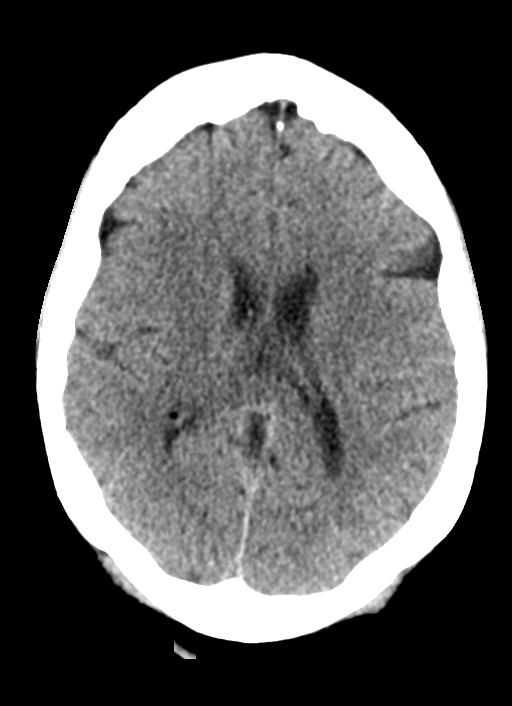
[im 21/31  brain]
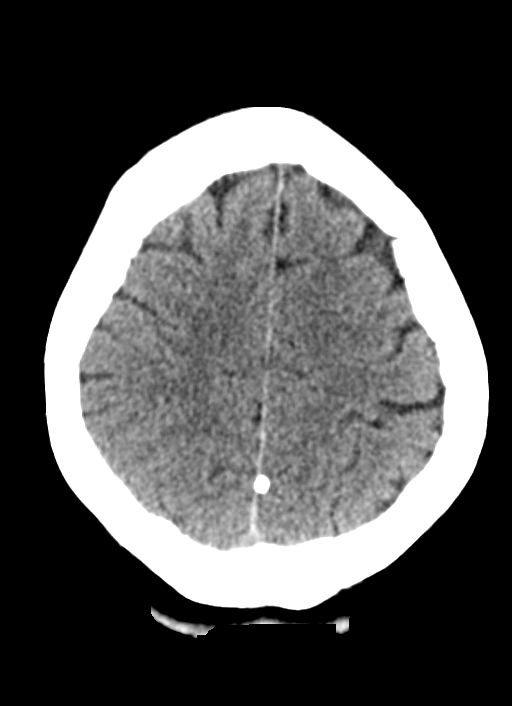
[im 26/31  brain]
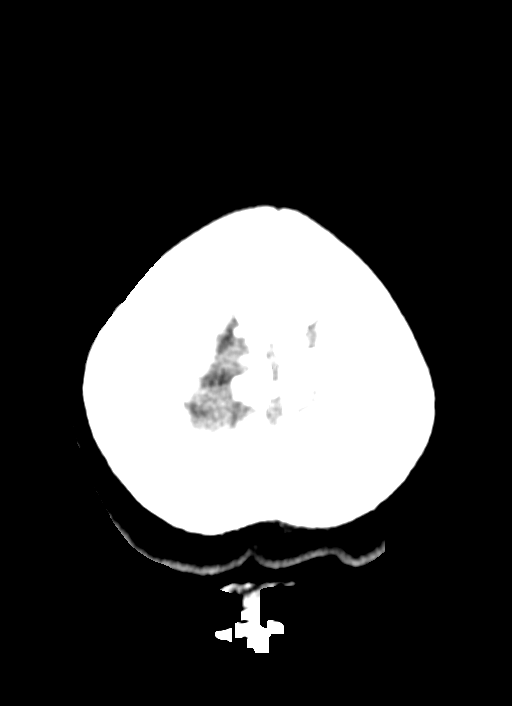
[im 26/31  bone]
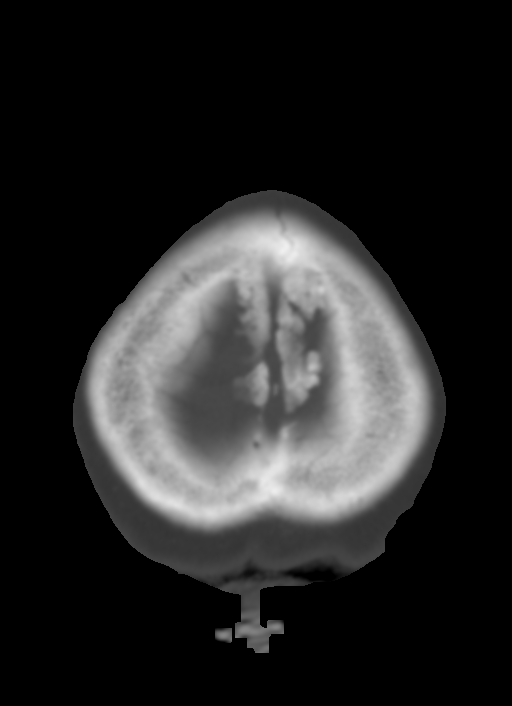

[13 of 47 positions shown; findings below may reference images not displayed]

FINDINGS: Brain: No evidence of acute infarction, hemorrhage, cerebral edema,
mass, mass effect, or midline shift. Ventricles and sulci are normal
for age. No extra-axial fluid collection.

Vascular: No hyperdense vessel or unexpected calcification.

Skull: Normal. Negative for fracture or focal lesion.

Sinuses/Orbits: No acute finding.

Other: The mastoid air cells are well aerated. No significant
hematoma or laceration.
IMPRESSION: No acute intracranial process.

## 2021-09-25 IMAGING — DX DG SHOULDER 1V*L*
1 series · 1 of 1 positions shown · non-contrast
Comparison: None.

CLINICAL DATA: Right shoulder and proximal humeral pain.

EXAM:
LEFT SHOULDER

[shoulder y view]
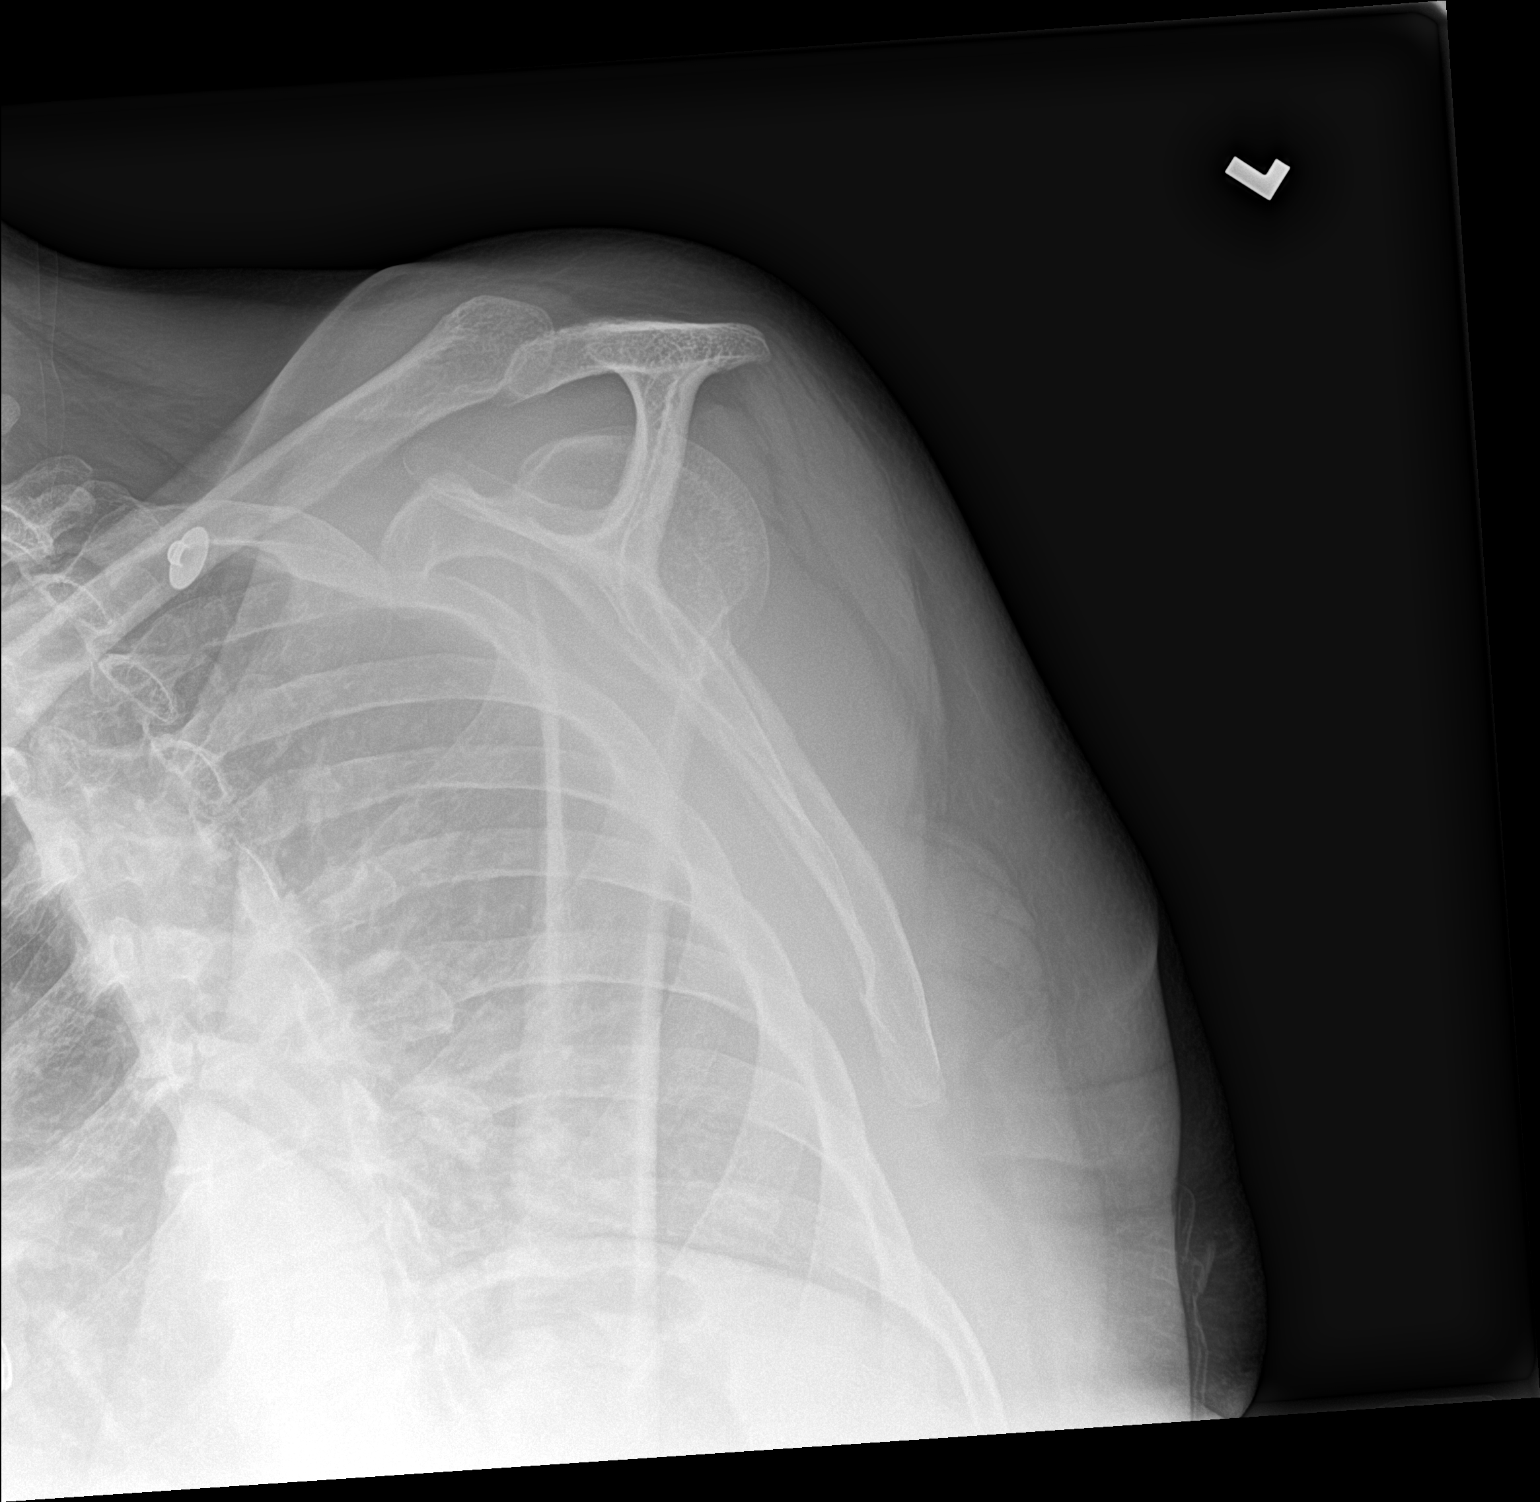

[1 of 1 positions shown; findings below may reference images not displayed]

FINDINGS: Single view radiograph of the left shoulder as wrong side was
ordered. Unremarkable.
IMPRESSION: Unremarkable single view radiograph of the left shoulder as wrong
side was ordered.
